# Patient Record
Sex: Female | Born: 1945 | Hispanic: No | Smoking: Never smoker
Health system: Southern US, Community
[De-identification: ages and names within clinical notes are randomized; demographics above are authoritative.]

## PROBLEM LIST (undated history)

## (undated) DIAGNOSIS — E785 Hyperlipidemia, unspecified: Secondary | ICD-10-CM

## (undated) DIAGNOSIS — I509 Heart failure, unspecified: Secondary | ICD-10-CM

## (undated) DIAGNOSIS — I10 Essential (primary) hypertension: Secondary | ICD-10-CM

---

## 2003-08-12 ENCOUNTER — Other Ambulatory Visit: Payer: Self-pay

## 2004-08-02 ENCOUNTER — Ambulatory Visit: Payer: Self-pay | Admitting: Family Medicine

## 2005-06-14 ENCOUNTER — Ambulatory Visit: Payer: Self-pay | Admitting: Family Medicine

## 2005-09-13 ENCOUNTER — Ambulatory Visit: Payer: Self-pay | Admitting: Family Medicine

## 2006-10-24 ENCOUNTER — Ambulatory Visit: Payer: Self-pay | Admitting: Internal Medicine

## 2007-01-23 ENCOUNTER — Ambulatory Visit: Payer: Self-pay | Admitting: Internal Medicine

## 2007-01-27 ENCOUNTER — Other Ambulatory Visit: Payer: Self-pay

## 2007-01-28 ENCOUNTER — Inpatient Hospital Stay: Payer: Self-pay | Admitting: Internal Medicine

## 2007-02-18 ENCOUNTER — Ambulatory Visit: Payer: Self-pay | Admitting: Family

## 2007-04-03 ENCOUNTER — Ambulatory Visit: Payer: Self-pay | Admitting: Family

## 2007-10-27 ENCOUNTER — Emergency Department: Payer: Self-pay | Admitting: Emergency Medicine

## 2007-10-27 ENCOUNTER — Other Ambulatory Visit: Payer: Self-pay

## 2007-10-31 ENCOUNTER — Ambulatory Visit: Payer: Self-pay | Admitting: Internal Medicine

## 2007-11-27 ENCOUNTER — Ambulatory Visit: Payer: Self-pay | Admitting: Internal Medicine

## 2008-05-06 ENCOUNTER — Inpatient Hospital Stay: Payer: Self-pay | Admitting: Internal Medicine

## 2008-05-07 ENCOUNTER — Ambulatory Visit: Payer: Self-pay | Admitting: Cardiology

## 2008-07-09 ENCOUNTER — Ambulatory Visit: Payer: Self-pay | Admitting: Internal Medicine

## 2011-04-13 IMAGING — CT CT ABD-PELV W/ CM
1 of 3 series · 13 of 32 positions shown, 18 images · non-contrast
Comparison: none

REASON FOR EXAM: abd pelvic pain
COMMENTS:

[Series 2: abdomen · axial · 0.63mm/px · z∈[-472,-88]mm · 13 of 87 slices shown, 18 images]
[im 5/87  soft-tissue]
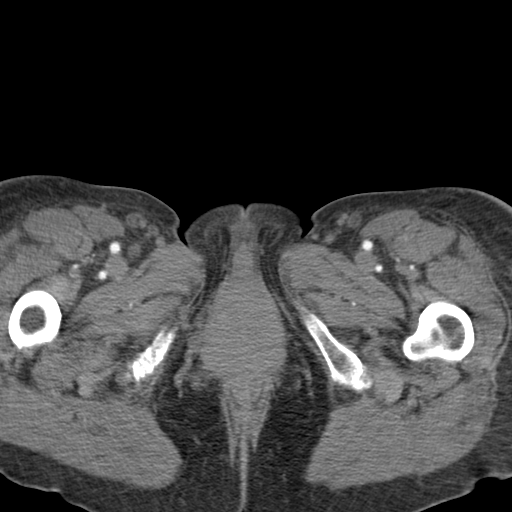
[im 5/87  bone]
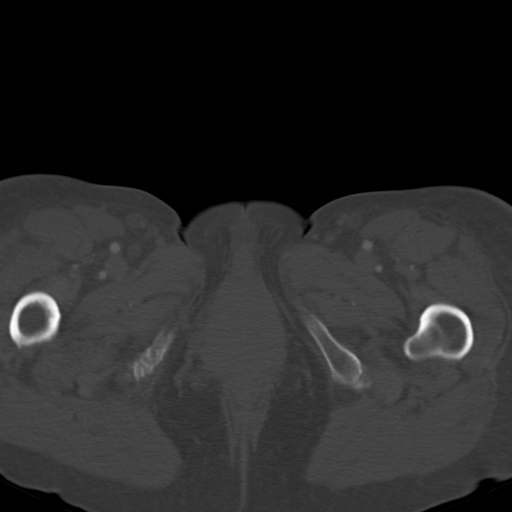
[im 15/87  soft-tissue]
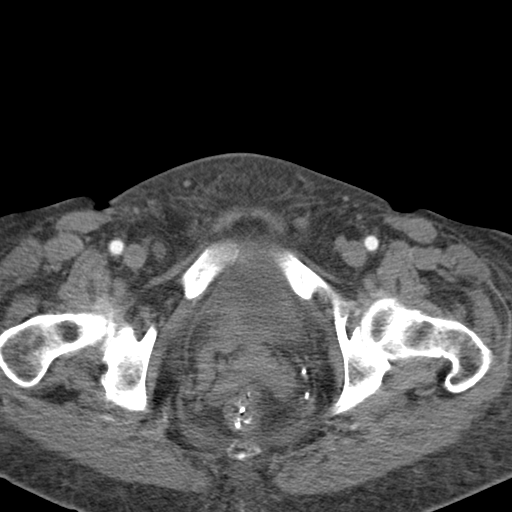
[im 20/87  soft-tissue]
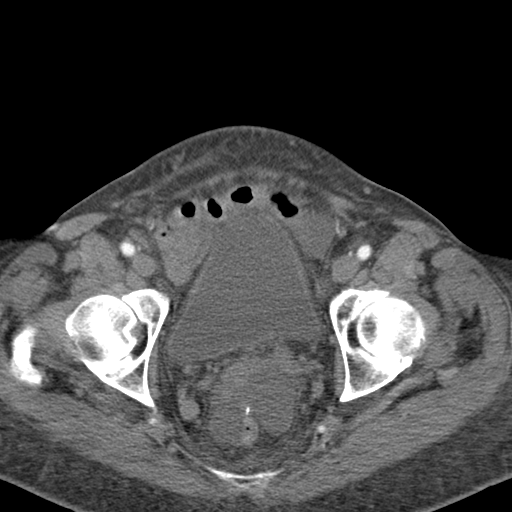
[im 24/87  soft-tissue]
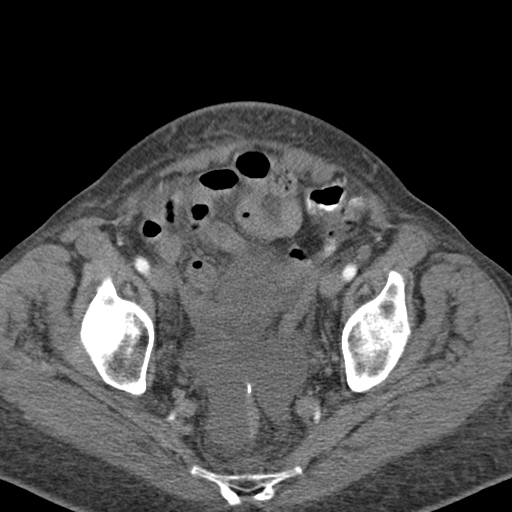
[im 34/87  soft-tissue]
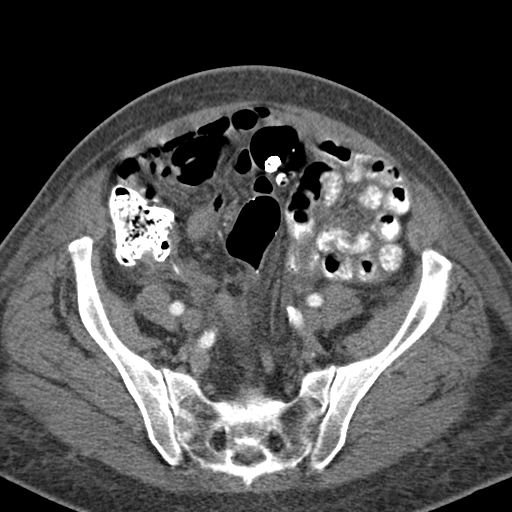
[im 39/87  soft-tissue]
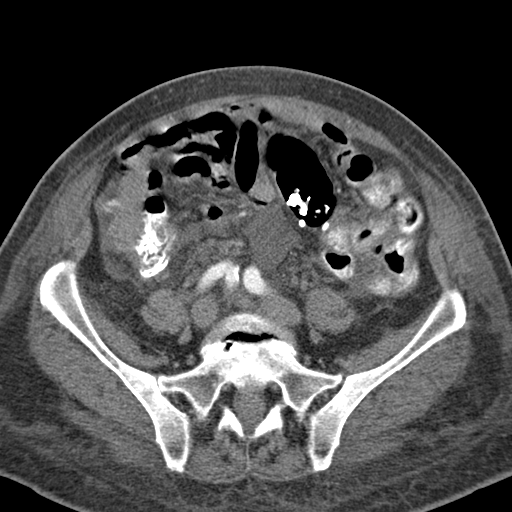
[im 48/87  soft-tissue]
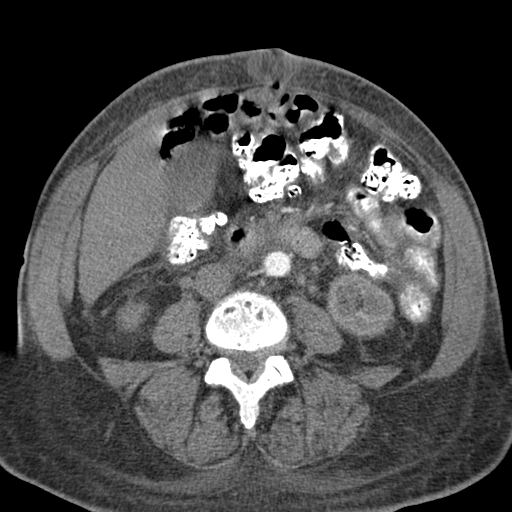
[im 53/87  soft-tissue]
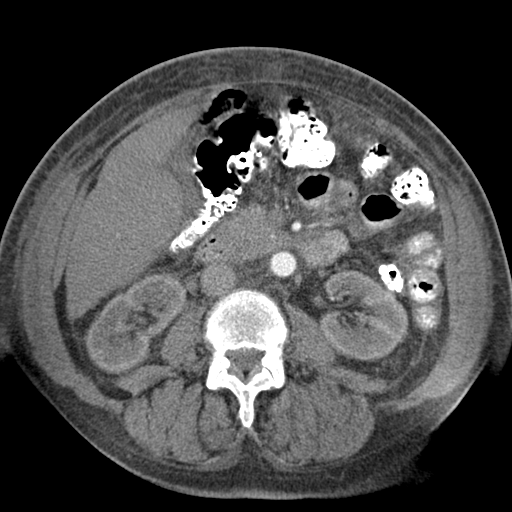
[im 63/87  soft-tissue]
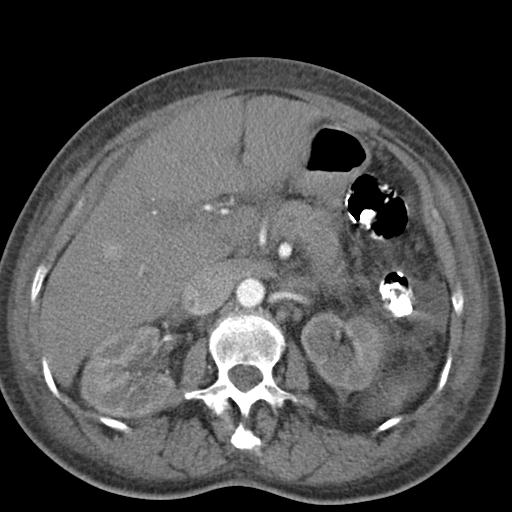
[im 63/87  bone]
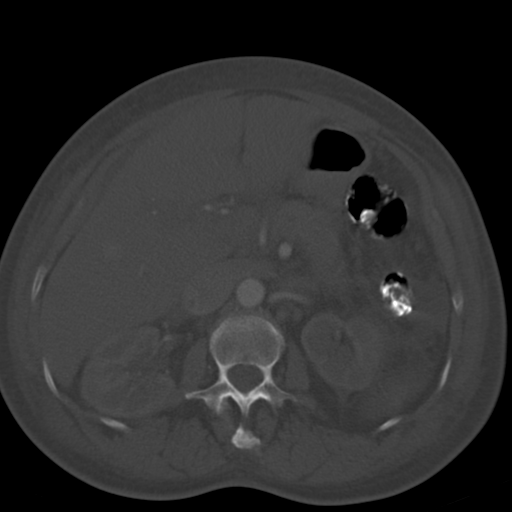
[im 67/87  soft-tissue]
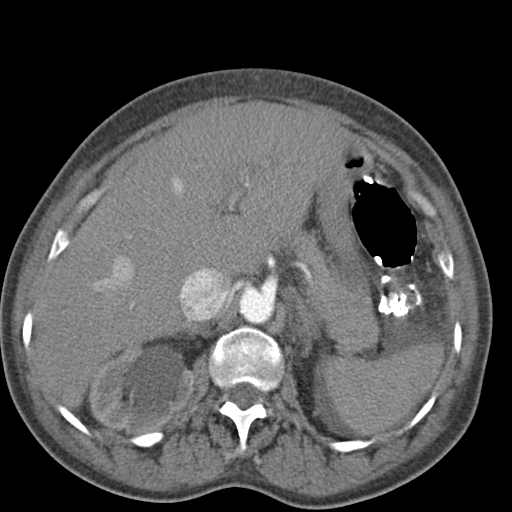
[im 67/87  lung]
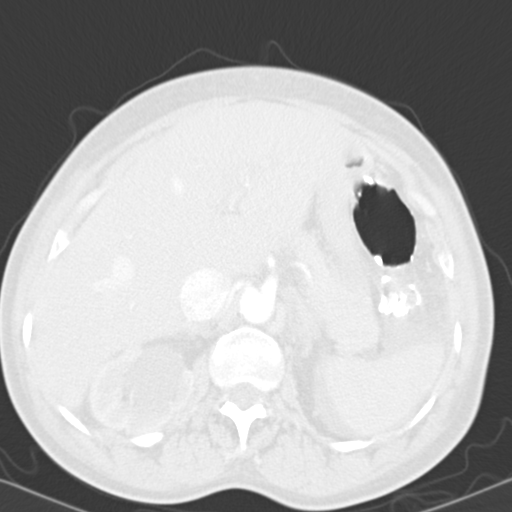
[im 72/87  soft-tissue]
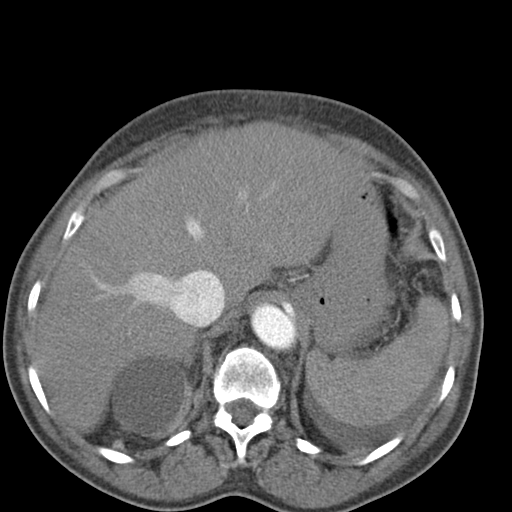
[im 72/87  lung]
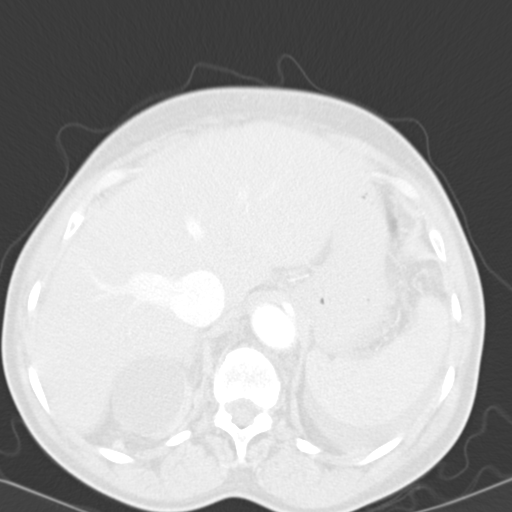
[im 77/87  lung]
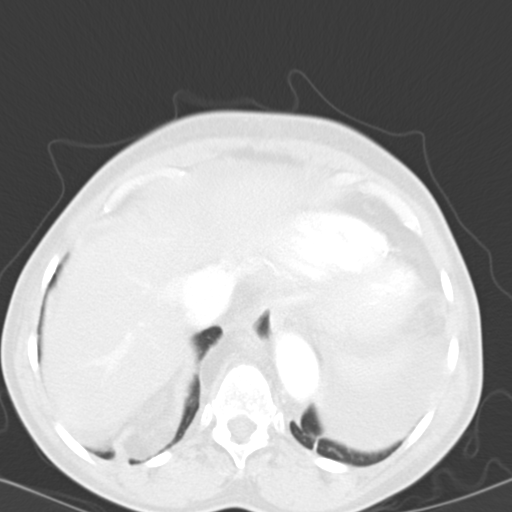
[im 82/87  soft-tissue]
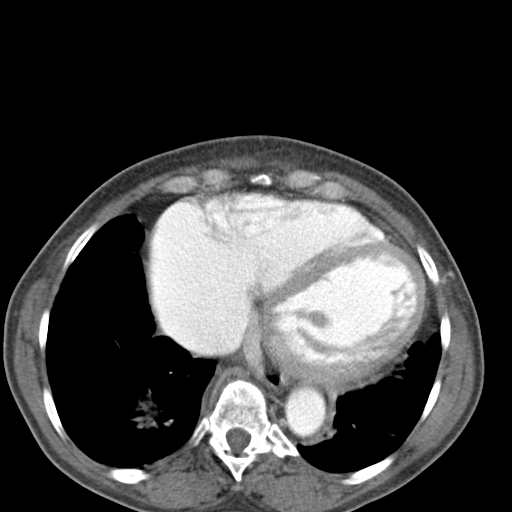
[im 82/87  lung]
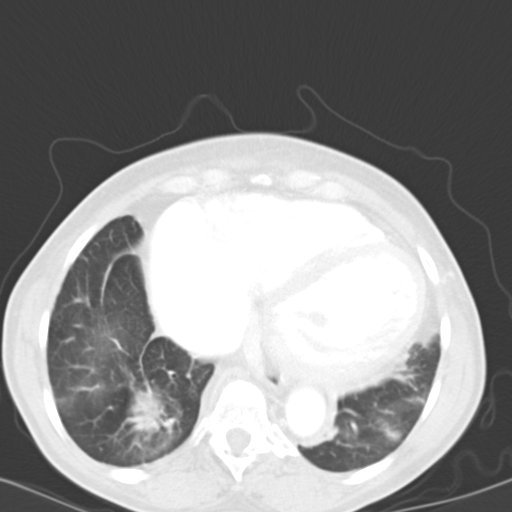

[13 of 32 positions shown; findings below may reference images not displayed]

PROCEDURE:     CT  - CT ABDOMEN / PELVIS  W  - July 09, 2008  [DATE]

RESULT:     Axial contrast enhanced CT scanning was performed through the
abdomen and pelvis following administration of 100 cc of Qsovue-FIZ. The
patient also received oral contrast material. Review of 3-dimensional
reconstructed images was performed separately on the [employer]
monitor. Comparison made to the study 28 October, 2007.

 The bowel gas pattern does not suggest obstruction. On image is 38 through
40 there is a tiny amount of air that enters the deep portion of a small
periumbilical hernia. This may be symptomatic in the appropriate clinical
setting, but it does not appear to be obstructive currently. There is free
fluid in the pelvis. In the right lower quadrant of the abdomen a definite
inflamed appendix is not demonstrated, but the small bowel in this region is
not opacified. Within the pelvis the uterus is surgically absent. I see no
adnexal masses. The partially distended urinary bladder is normal in
appearance. The sigmoid colon exhibits no evidence of acute inflammation.

The cardiac chambers are markedly enlarged. I see no significant pleural
fluid collections. Mildly increased density at both lung bases is consistent
with fibrosis or subsegmental atelectasis. The caliber of the abdominal
aorta is normal. The liver, gallbladder, and spleen are normal in
appearance. As best as can be determined the pancreas is normal. There is
free fluid adjacent to the liver and the spleen. The kidneys exhibit no
evidence of obstruction. I see no adrenal masses. The stomach is
nondistended.
IMPRESSION: 1. There is abnormal fluid within the abdomen and pelvis that is of
uncertain etiology. Correlation with patient's protein status as well as
hepatic function studies would be useful. I do not see evidence of
gallstones.
2. The kidneys exhibit no evidence of obstruction. There is cortical
thinning diffusely. There is a large cortically based cyst associated with
the upper pole of the right kidney.
3. The bowel is only partially evaluated due to the small volume of the
orally administered contrast present. There is a small ventral hernia and a
tiny amount of air is noted within its deepest portion. This likely reflects
a loop of small bowel which has partial entered this hernia. No obstruction
is identified at this point.
4. The appendix is not discretely identified. There are loops of unopacified
small bowel in the right lower quadrant of the abdomen. There is no discrete
abscess identified.

Correlation clinically with the patient's clinical and laboratory values is
needed. The ascites is unexplained. Given the marked enlargement of the
cardiac chambers right heart failure may be contributing to the ascites. If
there are strong clinical concerns of acute bowel abnormality, repeat
scanning following vigorous administration of oral contrast material may be
useful.

## 2015-05-10 ENCOUNTER — Emergency Department
Admission: EM | Admit: 2015-05-10 | Discharge: 2015-05-10 | Disposition: A | Discharge status: Home / Self Care | Payer: Medicare Other | Attending: Emergency Medicine | Admitting: Emergency Medicine

## 2015-05-10 ENCOUNTER — Encounter: Payer: Self-pay | Admitting: Emergency Medicine

## 2015-05-10 DIAGNOSIS — Y9289 Other specified places as the place of occurrence of the external cause: Secondary | ICD-10-CM | POA: Diagnosis not present

## 2015-05-10 DIAGNOSIS — T783XXA Angioneurotic edema, initial encounter: Secondary | ICD-10-CM | POA: Insufficient documentation

## 2015-05-10 DIAGNOSIS — Y998 Other external cause status: Secondary | ICD-10-CM | POA: Diagnosis not present

## 2015-05-10 DIAGNOSIS — Y9389 Activity, other specified: Secondary | ICD-10-CM | POA: Insufficient documentation

## 2015-05-10 DIAGNOSIS — I10 Essential (primary) hypertension: Secondary | ICD-10-CM | POA: Diagnosis not present

## 2015-05-10 DIAGNOSIS — X58XXXA Exposure to other specified factors, initial encounter: Secondary | ICD-10-CM | POA: Insufficient documentation

## 2015-05-10 DIAGNOSIS — R220 Localized swelling, mass and lump, head: Secondary | ICD-10-CM | POA: Diagnosis present

## 2015-05-10 DIAGNOSIS — Z79899 Other long term (current) drug therapy: Secondary | ICD-10-CM | POA: Insufficient documentation

## 2015-05-10 HISTORY — DX: Essential (primary) hypertension: I10

## 2015-05-10 HISTORY — DX: Hyperlipidemia, unspecified: E78.5

## 2015-05-10 HISTORY — DX: Heart failure, unspecified: I50.9

## 2015-05-10 MED ORDER — SODIUM CHLORIDE 0.9 % IV BOLUS (SEPSIS): 250.0000 mL | Freq: Once | INTRAVENOUS | Status: DC

## 2015-05-10 MED ORDER — FAMOTIDINE IN NACL 20-0.9 MG/50ML-% IV SOLN
20.0000 mg | Freq: Once | INTRAVENOUS | Status: AC
  Administered 2015-05-10: 20 mg via INTRAVENOUS
  Filled 2015-05-10: qty 50

## 2015-05-10 MED ORDER — PREDNISONE 10 MG PO TABS: ORAL_TABLET | ORAL | Status: AC

## 2015-05-10 MED ORDER — SODIUM CHLORIDE 0.9 % IV BOLUS (SEPSIS)
30.0000 mL/kg | Freq: Once | INTRAVENOUS | Status: AC
  Administered 2015-05-10: 2448 mL via INTRAVENOUS

## 2015-05-10 MED ORDER — RANITIDINE HCL 150 MG PO TABS: 150.0000 mg | ORAL_TABLET | Freq: Two times a day (BID) | ORAL | Status: AC

## 2015-05-10 MED ORDER — DIPHENHYDRAMINE HCL 50 MG/ML IJ SOLN
25.0000 mg | Freq: Once | INTRAMUSCULAR | Status: AC
  Administered 2015-05-10: 25 mg via INTRAVENOUS
  Filled 2015-05-10: qty 1

## 2015-05-10 MED ORDER — DEXAMETHASONE SODIUM PHOSPHATE 10 MG/ML IJ SOLN
10.0000 mg | Freq: Once | INTRAMUSCULAR | Status: AC
  Administered 2015-05-10: 10 mg via INTRAVENOUS
  Filled 2015-05-10: qty 1

## 2015-05-10 NOTE — ED Provider Notes (Signed)
[facility] Emergency Department Provider Note  ____________________________________________  Time seen: Approximately 2:32 PM  I have reviewed the triage vital signs and the nursing notes.   HISTORY  Chief Complaint Oral Swelling   HPI Kelsey Duncan is a 70 y.o. female is here with complaint of left upper lip swelling approximately one hour prior to arrival in the emergency room. Patient denies any new foods, medication, soaps or clothing. Patient denies any difficulty breathing or talking. She states this is never happened before. She generally takes Zyrtec for seasonal allergies.Family member with the patient states that she is talking with her normal voice. Currently she rates her pain as a 0/10.   Past Medical History  Diagnosis Date  . CHF (congestive heart failure) (HCC)   . Hypertension   . Hyperlipidemia     There are no active problems to display for this patient.   History reviewed. No pertinent past surgical history.  Current Outpatient Rx  Name  Route  Sig  Dispense  Refill  . amiodarone (PACERONE) 200 MG tablet   Oral   Take 200 mg by mouth daily.         Marland Kitchen amLODipine (NORVASC) 10 MG tablet   Oral   Take 10 mg by mouth daily.         . digoxin (LANOXIN) 0.125 MG tablet   Oral   Take 0.125 mg by mouth daily.         . furosemide (LASIX) 20 MG tablet   Oral   Take 20 mg by mouth.         Marland Kitchen lisinopril (PRINIVIL,ZESTRIL) 20 MG tablet   Oral   Take 20 mg by mouth daily.         . metoprolol succinate (TOPROL-XL) 100 MG 24 hr tablet   Oral   Take 100 mg by mouth daily. Take with or immediately following a meal.         . rosuvastatin (CRESTOR) 10 MG tablet   Oral   Take 10 mg by mouth daily.         Marland Kitchen spironolactone (ALDACTONE) 50 MG tablet   Oral   Take 50 mg by mouth daily.         . predniSONE (DELTASONE) 10 MG tablet      Take 6 tablets  today, on day 2 take 5 tablets, day 3 take 4 tablets, day 4 take  3 tablets, day 5 take  2 tablets and 1 tablet the last day   21 tablet   0   . ranitidine (ZANTAC) 150 MG tablet   Oral   Take 1 tablet (150 mg total) by mouth 2 (two) times daily.   60 tablet   1     Allergies Review of patient's allergies indicates no known allergies.  History reviewed. No pertinent family history.  Social History Social History  Substance Use Topics  . Smoking status: Never Smoker   . Smokeless tobacco: None  . Alcohol Use: No    Review of Systems Constitutional: No fever/chills Eyes: No visual changes. ENT: No sore throat. No difficulty swallowing. Positive left upper lip swelling. Cardiovascular: Denies chest pain. Respiratory: Denies shortness of breath. Gastrointestinal: .  No nausea, no vomiting.   Genitourinary: Negative for dysuria. Musculoskeletal: Negative for back pain. Skin: Positive left upper lip swelling but no rash noted. Neurological: Negative for headaches, focal weakness or numbness.  10-point ROS otherwise negative.  ____________________________________________   PHYSICAL EXAM:  VITAL SIGNS:  ED Triage Vitals  Enc Vitals Group     BP 05/10/15 1401 149/81 mmHg     Pulse Rate 05/10/15 1401 68     Resp 05/10/15 1401 20     Temp 05/10/15 1401 98.1 F (36.7 C)     Temp Source 05/10/15 1401 Oral     SpO2 05/10/15 1401 93 %     Weight 05/10/15 1401 180 lb (81.647 kg)     Height 05/10/15 1401  (1.727 m)     Head Cir --      Peak Flow --      Pain Score 05/10/15 1402 0     Pain Loc --      Pain Edu? --      Excl. in GC? --     Constitutional: Alert and oriented. Well appearing and in no acute distress. Eyes: Conjunctivae are normal. PERRL. EOMI. Head: Atraumatic. Nose: No congestion/rhinnorhea. Mouth/Throat: Mucous membranes are moist.  Oropharynx non-erythematous. Left upper lip is swollen and nontender to palpation. There is no erythema or warmth. There is no evidence of trauma. Neck: No stridor.    Hematological/Lymphatic/Immunilogical: No cervical lymphadenopathy. Cardiovascular: Normal rate, regular rhythm. Grossly normal heart sounds.  Good peripheral circulation. Respiratory: Normal respiratory effort.  No retractions. Lungs CTAB. Gastrointestinal: Soft and nontender. No distention. Musculoskeletal: Moves upper and lower extremities without any difficulty and normal gait was noted. Neurologic:  Normal speech and language. No gross focal neurologic deficits are appreciated. No gait instability. Skin:  Skin is warm, dry and intact. No rash noted. Psychiatric: Mood and affect are normal. Speech and behavior are normal.  ____________________________________________   LABS (all labs ordered are listed, but only abnormal results are displayed)  Labs Reviewed - No data to display  PROCEDURES  Procedure(s) performed: None  Critical Care performed: No  ____________________________________________   INITIAL IMPRESSION / ASSESSMENT AND PLAN / ED COURSE  Pertinent labs & imaging results that were available during my care of the patient were reviewed by me and considered in my medical decision making (see chart for details).  ----------------------------------------- 5:47 PM on 05/10/2015 ----------------------------------------- Patient states that her lip is feeling much better. She continues to talk, swallow and speak without difficulty. There is been no respiratory symptoms. She states that she is feeling much better and is anxious to go home. Patient will continue taking Zyrtec daily. She is also given prednisone 60 mg 6 day taper, and Zantac 50 mg twice a day. She is to call her doctor tomorrow for follow-up appointment. She is told to return to the emergency room if any severe worsening of her symptoms such as difficulty breathing or swallowing. We discussed that this possibly could be her blood pressure medication and she will speak to her doctor about  this.  ____________________________________________   FINAL CLINICAL IMPRESSION(S) / ED DIAGNOSES  Final diagnoses:  Angioedema of lips, initial encounter      Tommi Rumps, PA-C 05/10/15 1749  Darci Current, MD 05/11/15 [phone]

## 2015-05-10 NOTE — Discharge Instructions (Signed)
Angioedema Angioedema is sudden puffiness (swelling), often of the skin. It can happen:  On your face or privates (genitals).  In your belly (abdomen) or other body parts. It usually happens quickly and gets better in 1 or 2 days. It often starts at night and is found when you wake up. You may get red, itchy patches of skin (hives). Attacks can be dangerous if your breathing passages get puffy. The condition may happen only once, or it can come back at random times. It may happen for several years before it goes away for good. HOME CARE  Only take medicines as told by your doctor.  Always carry your emergency allergy medicines with you.  Wear a medical bracelet as told by your doctor.  Avoid things that you know will cause attacks (triggers). GET HELP IF:  You have another attack.  Your attacks happen more often or get worse.  The condition was passed to you by your parents and you want to have children. GET HELP RIGHT AWAY IF:   Your mouth, tongue, or lips are very puffy.  You have trouble breathing.  You have trouble swallowing.  You pass out (faint). MAKE SURE YOU:   Understand these instructions.  Will watch your condition.  Will get help right away if you are not doing well or get worse.   This information is not intended to replace advice given to you by your health care provider. Make sure you discuss any questions you have with your health care provider.   Document Released: 02/21/2009 Document Revised: 12/24/2012 Document Reviewed: 10/27/2012 Elsevier Interactive Patient Education 2016 [employer].   Continue taking Zyrtec daily. Begin prednisone as directed and Zantac 150 mg twice a day. Call your doctor tomorrow to schedule appointment. Return to the emergency room immediately if any difficulty with swallowing or speaking or severe worsening.

## 2015-05-10 NOTE — ED Notes (Signed)
Pt to ed with c/o swelling to left side of upper lip that started about 1 hour pta.  Pt denies difficulty breathing, denies sob, denies difficulty swallowing.

## 2015-05-10 NOTE — ED Notes (Signed)
States she developed some swelling to lip about 1 hour PTA  No diff swallowing

## 2018-04-19 DEATH — deceased
